# Patient Record
Sex: Female | Born: 1935 | Race: White | Hispanic: No | Marital: Married
Health system: Southern US, Community
[De-identification: ages and names within clinical notes are randomized; demographics above are authoritative.]

---

## 2013-06-30 ENCOUNTER — Inpatient Hospital Stay: Payer: Self-pay | Admitting: Surgery

## 2013-06-30 LAB — URINALYSIS, COMPLETE
Bilirubin,UR: NEGATIVE
Glucose,UR: NEGATIVE mg/dL (ref 0–75)
Hyaline Cast: 2
Ph: 5 (ref 4.5–8.0)
Protein: 30
WBC UR: 128 /HPF (ref 0–5)

## 2013-06-30 LAB — PROTIME-INR
INR: 2.4
Prothrombin Time: 25.6 secs — ABNORMAL HIGH (ref 11.5–14.7)

## 2013-06-30 LAB — CBC
HCT: 43 % (ref 35.0–47.0)
MCH: 30.3 pg (ref 26.0–34.0)
MCHC: 33.4 g/dL (ref 32.0–36.0)
RBC: 4.75 10*6/uL (ref 3.80–5.20)
RDW: 13.8 % (ref 11.5–14.5)
WBC: 17.9 10*3/uL — ABNORMAL HIGH (ref 3.6–11.0)

## 2013-06-30 LAB — COMPREHENSIVE METABOLIC PANEL
Alkaline Phosphatase: 118 U/L (ref 50–136)
Anion Gap: 3 — ABNORMAL LOW (ref 7–16)
BUN: 11 mg/dL (ref 7–18)
Bilirubin,Total: 0.5 mg/dL (ref 0.2–1.0)
Chloride: 107 mmol/L (ref 98–107)
Co2: 29 mmol/L (ref 21–32)
Creatinine: 0.82 mg/dL (ref 0.60–1.30)
Potassium: 4.1 mmol/L (ref 3.5–5.1)
SGOT(AST): 44 U/L — ABNORMAL HIGH (ref 15–37)
SGPT (ALT): 56 U/L (ref 12–78)
Sodium: 139 mmol/L (ref 136–145)
Total Protein: 7.2 g/dL (ref 6.4–8.2)

## 2013-06-30 LAB — LIPASE, BLOOD: Lipase: 49 U/L — ABNORMAL LOW (ref 73–393)

## 2013-07-01 LAB — COMPREHENSIVE METABOLIC PANEL
Albumin: 3 g/dL — ABNORMAL LOW (ref 3.4–5.0)
Alkaline Phosphatase: 100 U/L (ref 50–136)
Anion Gap: 8 (ref 7–16)
BUN: 8 mg/dL (ref 7–18)
Bilirubin,Total: 0.5 mg/dL (ref 0.2–1.0)
Calcium, Total: 8.6 mg/dL (ref 8.5–10.1)
Co2: 28 mmol/L (ref 21–32)
Creatinine: 0.65 mg/dL (ref 0.60–1.30)
EGFR (African American): >60
EGFR (Non-African Amer.): >60
Glucose: 81 mg/dL (ref 65–99)
Osmolality: 280 (ref 275–301)
SGOT(AST): 25 U/L (ref 15–37)
Total Protein: 6.5 g/dL (ref 6.4–8.2)

## 2013-07-01 LAB — MAGNESIUM: Magnesium: 1.6 mg/dL — ABNORMAL LOW

## 2013-07-01 LAB — CBC WITH DIFFERENTIAL/PLATELET
Eosinophil #: 0.3 10*3/uL (ref 0.0–0.7)
HCT: 41.4 % (ref 35.0–47.0)
HGB: 13.9 g/dL (ref 12.0–16.0)
Lymphocyte #: 1.1 10*3/uL (ref 1.0–3.6)
Lymphocyte %: 11.9 %
MCHC: 33.7 g/dL (ref 32.0–36.0)
MCV: 91 fL (ref 80–100)
Neutrophil #: 7 10*3/uL — ABNORMAL HIGH (ref 1.4–6.5)
Neutrophil %: 74.2 %
Platelet: 169 10*3/uL (ref 150–440)
RDW: 14.1 % (ref 11.5–14.5)
WBC: 9.5 10*3/uL (ref 3.6–11.0)

## 2013-07-01 LAB — PROTIME-INR
INR: 2.4
Prothrombin Time: 25.8 secs — ABNORMAL HIGH (ref 11.5–14.7)

## 2013-07-02 LAB — CBC WITH DIFFERENTIAL/PLATELET
Basophil #: 0.1 10*3/uL (ref 0.0–0.1)
Basophil %: 0.7 %
Eosinophil #: 0.1 10*3/uL (ref 0.0–0.7)
Eosinophil %: 0.7 %
HCT: 40.8 % (ref 35.0–47.0)
HGB: 13.9 g/dL (ref 12.0–16.0)
Lymphocyte #: 1.3 10*3/uL (ref 1.0–3.6)
Lymphocyte %: 10.8 %
MCH: 30.4 pg (ref 26.0–34.0)
MCV: 89 fL (ref 80–100)
Monocyte #: 1.5 x10 3/mm — ABNORMAL HIGH (ref 0.2–0.9)
Monocyte %: 12.5 %
Neutrophil #: 9.1 10*3/uL — ABNORMAL HIGH (ref 1.4–6.5)
Platelet: 179 10*3/uL (ref 150–440)
RBC: 4.58 10*6/uL (ref 3.80–5.20)

## 2013-07-02 LAB — BASIC METABOLIC PANEL
Anion Gap: 8 (ref 7–16)
BUN: 8 mg/dL (ref 7–18)
Calcium, Total: 8.2 mg/dL — ABNORMAL LOW (ref 8.5–10.1)
Creatinine: 0.6 mg/dL (ref 0.60–1.30)
EGFR (African American): >60
EGFR (Non-African Amer.): >60
Osmolality: 271 (ref 275–301)
Sodium: 137 mmol/L (ref 136–145)

## 2013-07-02 LAB — PROTIME-INR: Prothrombin Time: 22.2 secs — ABNORMAL HIGH (ref 11.5–14.7)

## 2013-07-03 LAB — COMPREHENSIVE METABOLIC PANEL
Albumin: 2.6 g/dL — ABNORMAL LOW (ref 3.4–5.0)
Alkaline Phosphatase: 90 U/L (ref 50–136)
Bilirubin,Total: 0.8 mg/dL (ref 0.2–1.0)
Chloride: 104 mmol/L (ref 98–107)
Co2: 25 mmol/L (ref 21–32)
Creatinine: 0.66 mg/dL (ref 0.60–1.30)
EGFR (African American): >60
Total Protein: 6.3 g/dL — ABNORMAL LOW (ref 6.4–8.2)

## 2013-07-03 LAB — PROTIME-INR: Prothrombin Time: 21.5 secs — ABNORMAL HIGH (ref 11.5–14.7)

## 2013-07-04 LAB — HEMOGLOBIN: HGB: 12.3 g/dL (ref 12.0–16.0)

## 2013-07-04 LAB — PLATELET COUNT: Platelet: 177 10*3/uL (ref 150–440)

## 2013-07-04 LAB — PROTIME-INR: Prothrombin Time: 21.5 secs — ABNORMAL HIGH (ref 11.5–14.7)

## 2013-07-04 LAB — APTT: Activated PTT: 95.7 secs — ABNORMAL HIGH (ref 23.6–35.9)

## 2013-07-05 LAB — APTT: Activated PTT: >160 secs (ref 23.6–35.9)

## 2013-07-05 LAB — PROTIME-INR: Prothrombin Time: 27.4 secs — ABNORMAL HIGH (ref 11.5–14.7)

## 2013-07-06 LAB — PLATELET COUNT: Platelet: 206 10*3/uL (ref 150–440)

## 2013-07-06 LAB — PROTIME-INR: Prothrombin Time: 32.7 secs — ABNORMAL HIGH (ref 11.5–14.7)

## 2013-07-06 LAB — HEMOGLOBIN: HGB: 11.6 g/dL — ABNORMAL LOW (ref 12.0–16.0)

## 2014-01-28 IMAGING — CT CT ABD-PELV W/O CM
1 of 2 series · 15 of 32 positions shown, 19 images · non-contrast
Comparison: None

REASON FOR EXAM: (1) abd pain; (2) abd pain
COMMENTS:

PROCEDURE:     CT  - CT ABDOMEN AND PELVIS W[DATE]  [DATE]
RESULT:     Indication: Abdominal pain
TECHNIQUE: Multiple axial images from the lung bases to the symphysis pubis
were obtained without oral and without intravenous contrast.

[Series 2: 3mm soft tissue · axial · 0.66mm/px · z∈[-394,-20]mm · 15 of 137 slices shown, 19 images]
[im 6/137  soft-tissue]
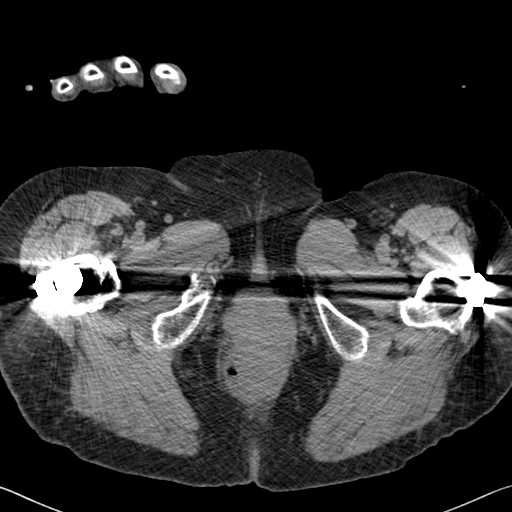
[im 6/137  bone]
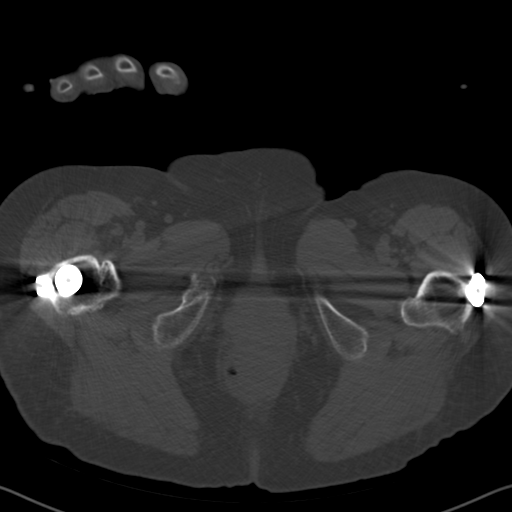
[im 18/137  soft-tissue]
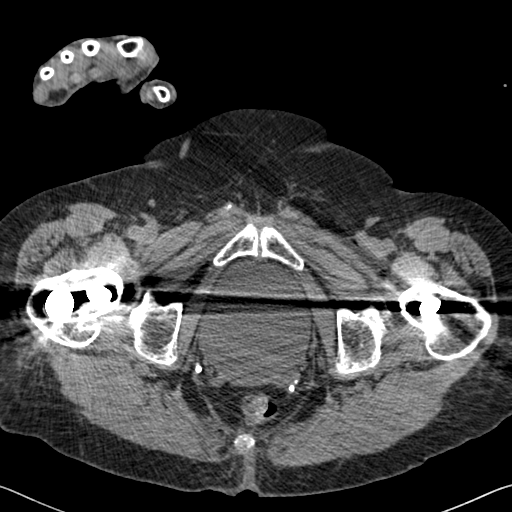
[im 29/137  soft-tissue]
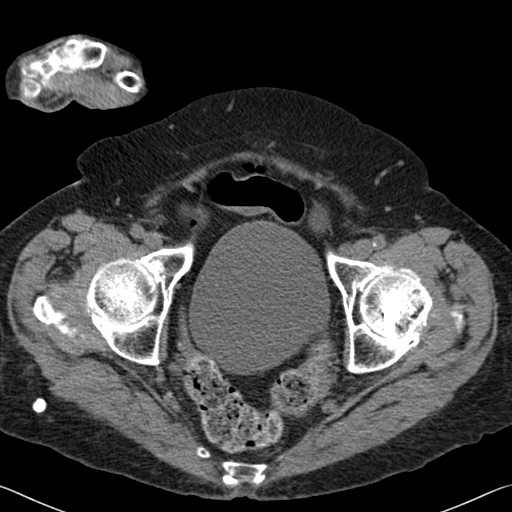
[im 40/137  soft-tissue]
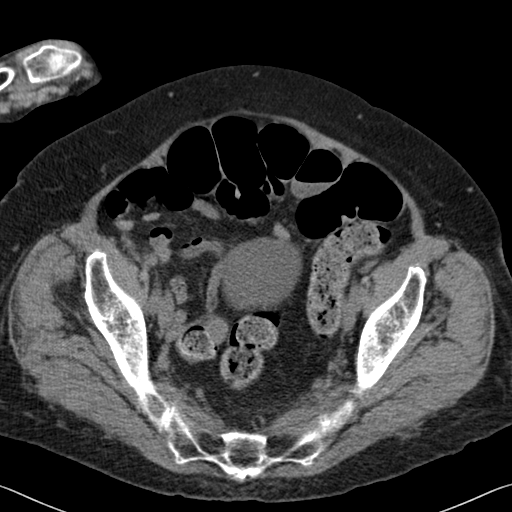
[im 46/137  soft-tissue]
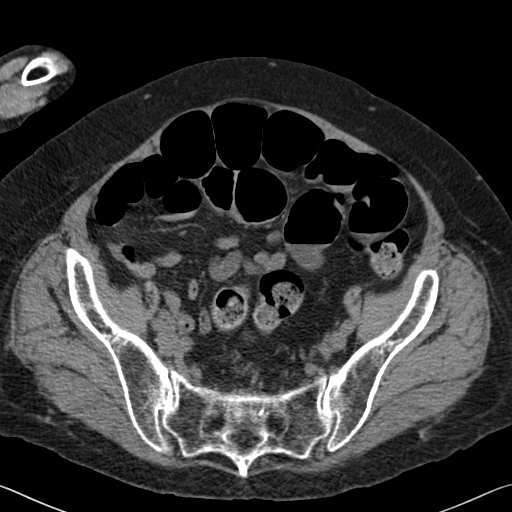
[im 57/137  soft-tissue]
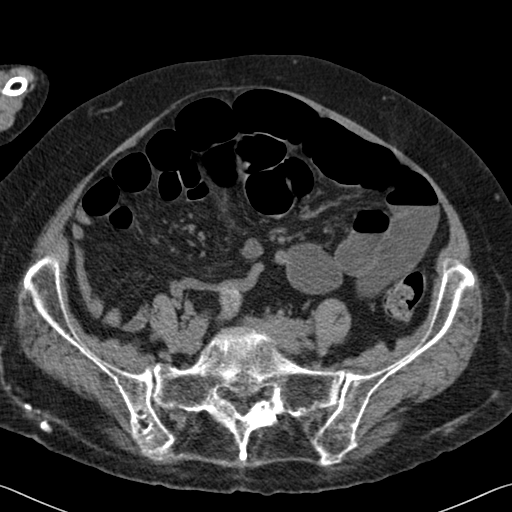
[im 69/137  soft-tissue]
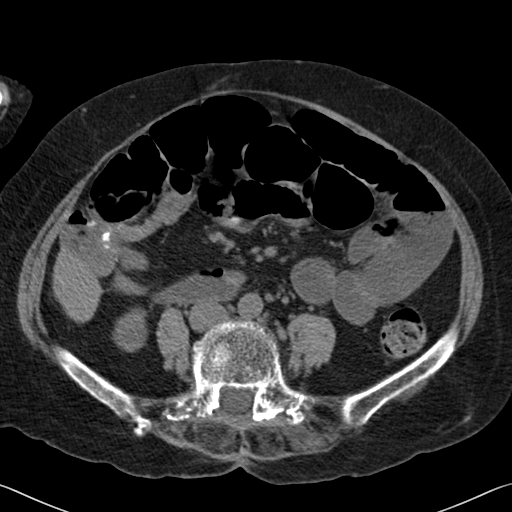
[im 80/137  soft-tissue]
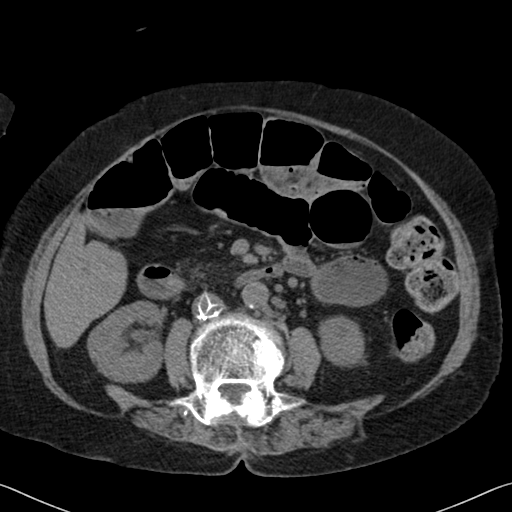
[im 91/137  soft-tissue]
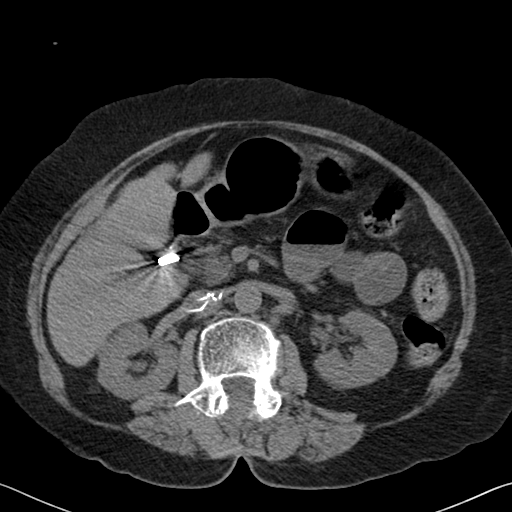
[im 91/137  bone]
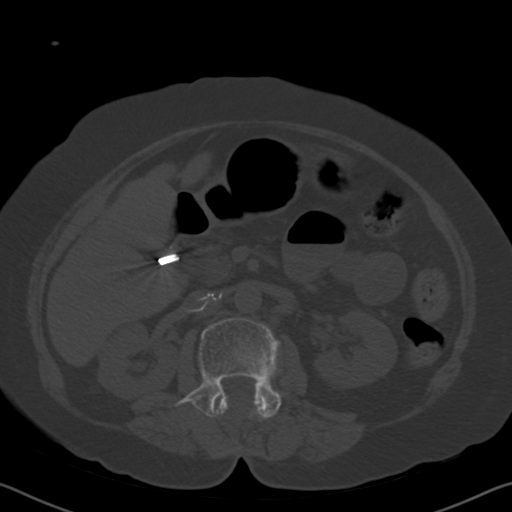
[im 97/137  soft-tissue]
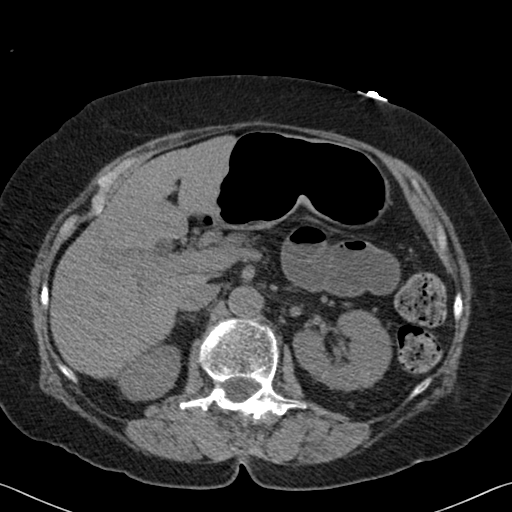
[im 108/137  soft-tissue]
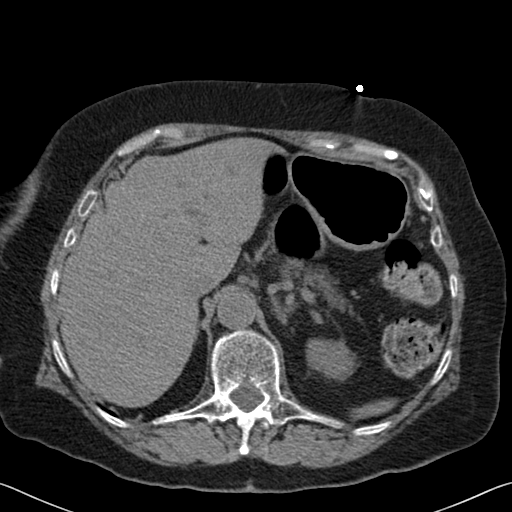
[im 114/137  lung]
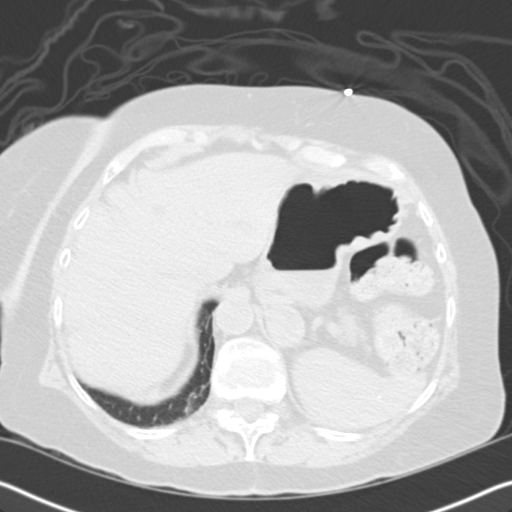
[im 120/137  soft-tissue]
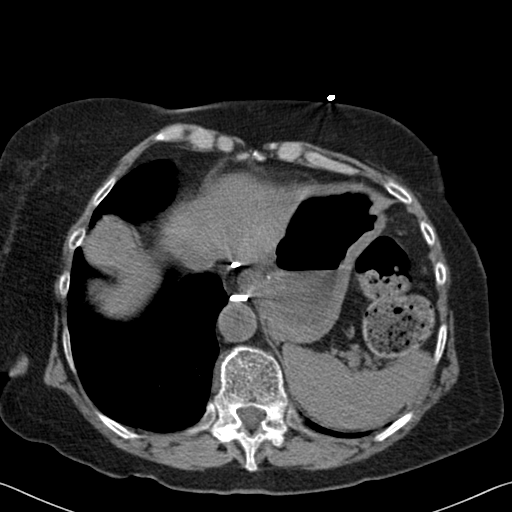
[im 120/137  lung]
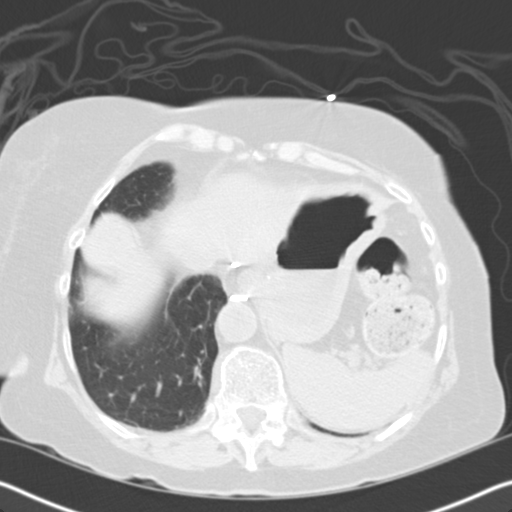
[im 125/137  lung]
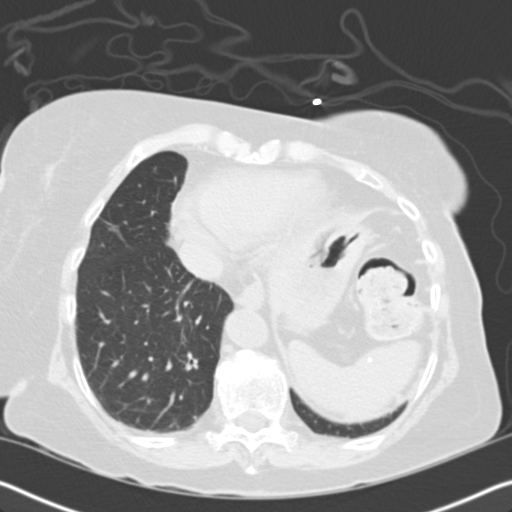
[im 131/137  soft-tissue]
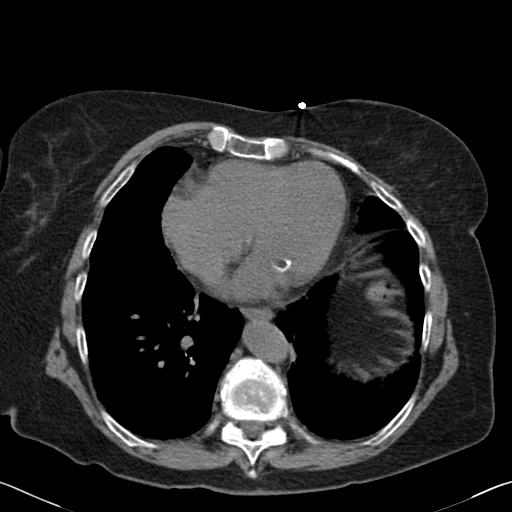
[im 131/137  lung]
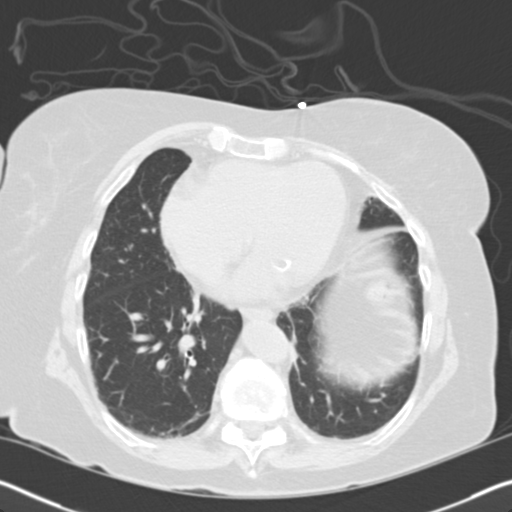

[15 of 32 positions shown; findings below may reference images not displayed]

FINDINGS: The lung bases are clear. There is no pleural or pericardial effusions.

There bilateral nephrolithiasis. No obstructive uropathy. No perinephric
stranding is seen. The kidneys are symmetric in size without evidence for
exophytic mass. The bladder is unremarkable.

The liver demonstrates no focal abnormality. The gallbladder is surgically
absent. The spleen demonstrates no focal abnormality. The adrenal glands and
pancreas are normal.

There is prior colonic resection with anastomotic suture line in the right
upper quadrant. There multiple small bowel air-fluid levels measuring up to
3.2 cm in diameter. The distal ileum is decompressed. Evaluation of the
bowel is somewhat limited secondary to lack of enteric contrast. There is no
pneumoperitoneum, pneumatosis, or portal venous gas. There is no abdominal
or pelvic free fluid. There is no lymphadenopathy.

The abdominal aorta is normal in caliber with atherosclerosis. There is an
IVC filter present.

There is lumbar spine spondylosis.
IMPRESSION: 1. Multiple mildly dilated loops of proximal small bowel with multiple
air-fluid levels and a decompressed distal ileum. This may reflect an ileus
versus small bowel obstruction.

2. Bilateral nonobstructing nephrolithiasis.

[REDACTED]

## 2014-01-30 IMAGING — CR DG CHEST 1V PORT
1 series · 1 of 1 positions shown · non-contrast
Comparison: none

REASON FOR EXAM: check central line placement
COMMENTS:

[ap]
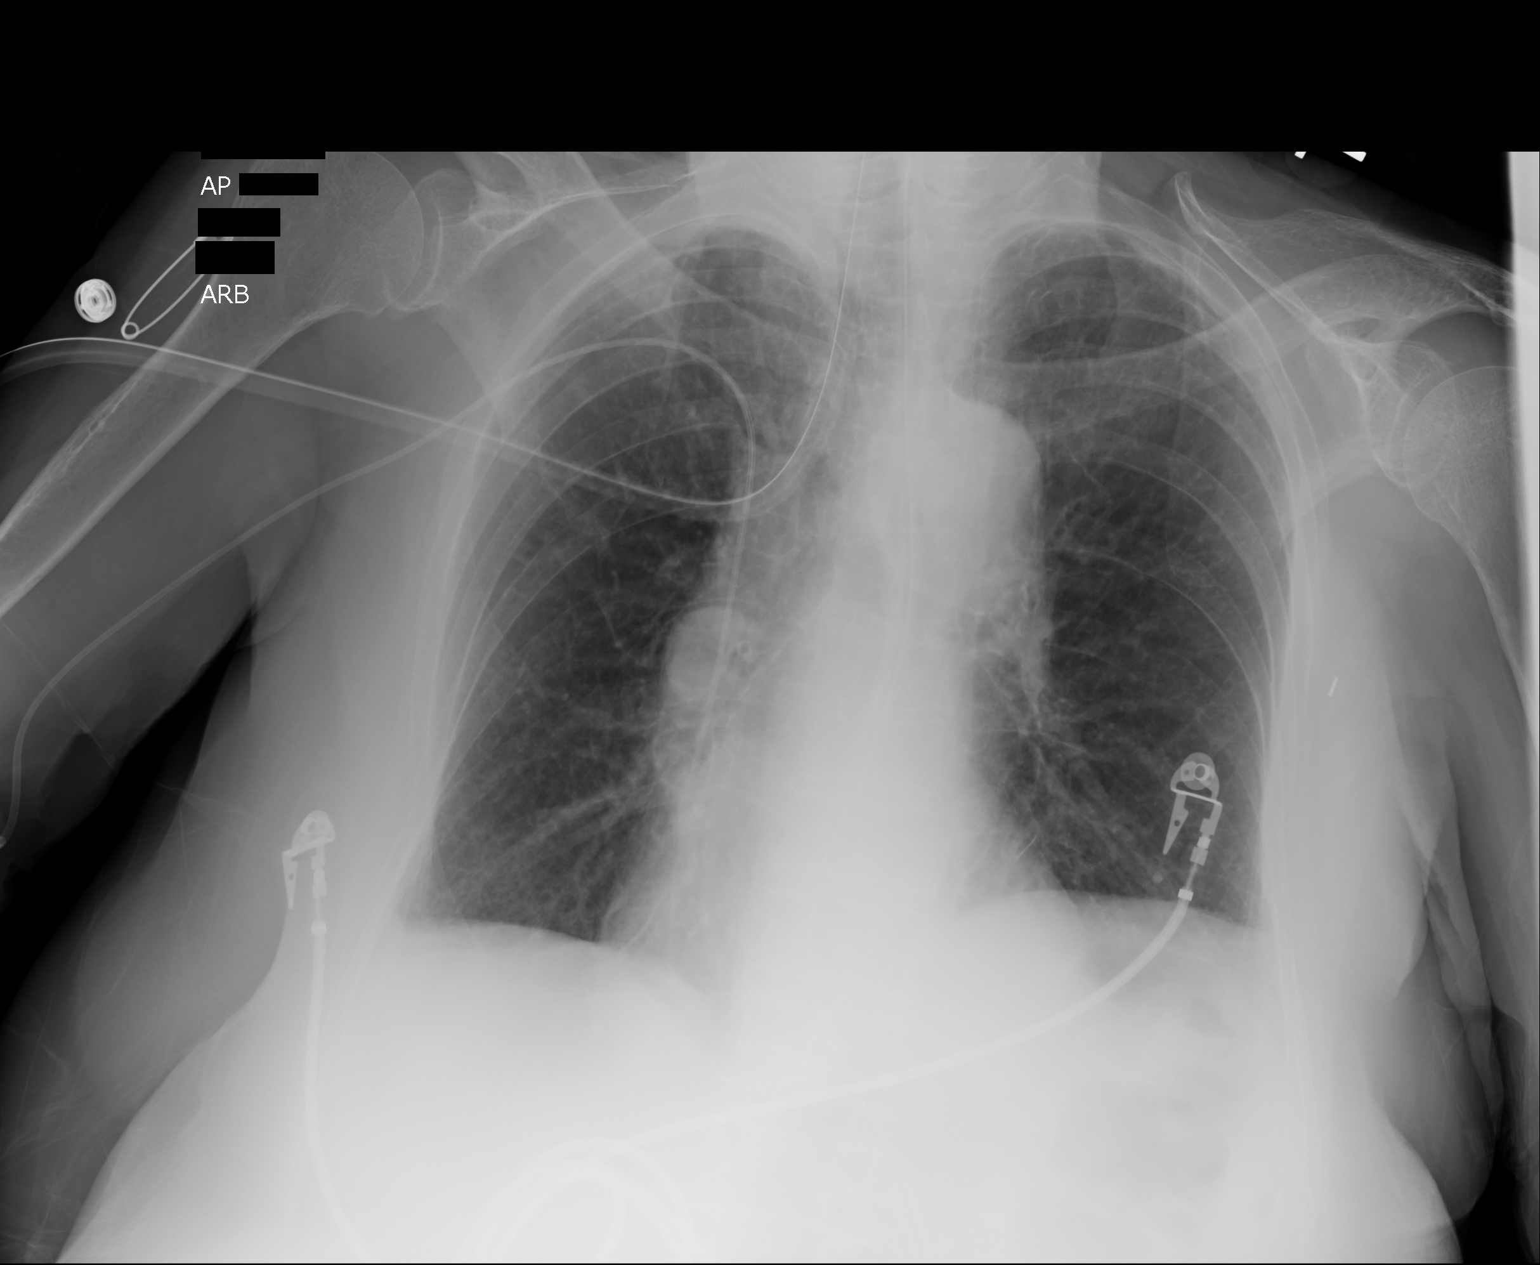

[1 of 1 positions shown; findings below may reference images not displayed]

PROCEDURE:     DXR - DXR PORTABLE CHEST SINGLE VIEW  - July 02, 2013  [DATE]

RESULT:     The patient has undergone placement of a PICC line via the right
upper extremity. The tip of the catheter lies in the region of the distal
SVC. The lungs are adequately inflated. There is no focal infiltrate. The
cardiac silhouette is normal in size. The mediastinum is normal in width.
IMPRESSION: There is no post procedure complication following placement
of the PICC line on the right.

[REDACTED]

## 2015-01-19 NOTE — Discharge Summary (Signed)
PATIENT NAME:  Sara Craig, Sara Craig MR#:  [id number] DATE OF BIRTH:  1932/08/02  DATE OF ADMISSION:  06/30/2013 DATE OF DISCHARGE:  07/06/2013  DISCHARGE DIAGNOSES: 1.  Small bowel obstruction, resolved.  2.  Asthma.  3.  Allergies.  4.  Diabetes mellitus.  5.  Recurrent deep vein thrombosis on Coumadin.  6.  Status post IVC filter.  7.  Status post appendectomy.  8.  Status post cholecystectomy.  9.  Status post colon resection.  10.  History of Nissen fundoplication.   ALLERGIES:  1.  ASPIRIN.  2.  AZITHROMYCIN.  3.  CLARITIN.  4.  CODEINE.  5.  GLYCOPYRROLATE.  6.  HYDROCHLOROTHIAZIDE.  7.  INAPSINE.  8.  ZYVOX.  9.  SULFA.  10.  IV DYE.  11.  VISTARIL.  12.  FOOD ALLERGIES.  DISCHARGE MEDICATIONS:   1.  Albuterol 3 mL inhaled q. 6 hours as needed shortness of breath.  2.   1000 mcg by mouth daily.  3.  Calcium plus vitamin D 1 tab three times daily.  4.  Flovent 1 puff three times daily.  5.  Flexeril 5 mg as needed.  6.  Neurontin 600 twice daily.  7.  Meclizine 25 three times daily as needed nausea and vomiting.  8.  Methadone 5 mg by mouth twice daily.  9.  Omeprazole 40 mg by mouth daily.  10.  Percocet 1 tab by mouth q. 4 hours as needed pain.  11.  Potassium chloride 20 mEq 1 tab twice daily.  12.  Warfarin 2.5 mg daily.  13.  Vitamin B12 injectable once a month.  14.  Vitamin D injectable once a month.   INDICATION FOR ADMISSION:  Ms. Sara Craig is a pleasant 81 year old female with multiple abdominal histories who comes in with abdominal pain, nausea and vomiting.  She had a CT concerning for a small bowel obstruction and she was admitted for management.   HOSPITAL COURSE:  Ms. Sara Craig was admitted and had an NG tube placed upon admission.  Her Coumadin was held until it was below therapeutic which was below 2, at which time she was begun on a heparin drip.  Internal medicine also assisted with diabetes control.  As her bowel function resolved radiographically and  clinically NG tube was removed and she was advanced from clear liquid to regular diet and resumed her warfarin at the time of discharge.  She was taking good by mouth and voiding and stooling without difficulties.   DISCHARGE INSTRUCTIONS:  Ms. Sara Craig is to call or return to the ED if has increased abdominal pain, nausea, vomiting or decreased bowel movement.  She is to follow up with me in approximately one week.    ____________________________ Si Raiderhristopher A. Kendahl Bumgardner, MD cal:ea D: 07/14/2013 20:13:36 ET T: 07/14/2013 23:36:33 ET JOB#: [id number]  cc: Cristal Deerhristopher A. Naomia Lenderman, MD, <Dictator> Jarvis NewcomerHRISTOPHER A Lashaun Krapf MD ELECTRONICALLY SIGNED 07/18/2013 13:11

## 2015-01-19 NOTE — Consult Note (Signed)
PATIENT NAME:  Sara Craig, Sara Craig MR#:  [id number] DATE OF BIRTH:  1932/01/23  INTERNAL MEDICINE CONSULTATION REPORT  DATE OF CONSULTATION:  06/30/2013  CONSULTING PHYSICIAN:  Dr. Juliann Pulse, of surgery.  REASON FOR CONSULTATION:  DVT, diabetes.   HISTORY OF PRESENTING ILLNESS: A 81 year old female patient with history of multiple abdominal surgeries, diabetes, history of deep vein thrombosis on Coumadin, presents to the Emergency Room with abdominal pain. The patient had a CT of the abdomen done which showed a small bowel obstruction. An NG tube was placed and she was admitted to the surgical service.   The patient's abdominal pain is better at this time. She did have nausea, but no vomiting. This has resolved. Does not complain of any shortness of breath, chest pain. The patient normally goes to [place]; is visiting her sister here. Had the abdominal pain. Had to be brought to the Emergency Room here.   Her DVT initially was in the left lower extremity in 1962, had total of 3 episodes will now, and the last episode was 10 years back and has been on chronic Coumadin therapy. INR is therapeutic at 2.5.   She has multiple allergies.   PAST MEDICAL HISTORY: Asthma.   1.  Allergies, causing shortness of breath.  2.  Constipation.  3.  Diabetes mellitus.  4.  DVT with 3 episodes, the first one in 1962 and the last one in 2004, on Coumadin.   PAST SURGICAL HISTORY: Appendectomy, cholecystectomy, colon resection, and Nissen fundoplication.   SOCIAL HISTORY: The patient lives at home. Ambulates on her own. Does not smoke. No alcohol. Her son died of an MI 2 months back and presently she is in bereavement. The patient is from [place] and is visiting her sister here in [place].   ALLERGIES: Has multiple, including aspirin, azithromycin, Claritin, codeine, glycopyrrolate, hydrochlorothiazide, Inapsine, Zyvox, sulfa, IV dye, Vistaril. Also has multiple food allergies.   FAMILY HISTORY: Diabetes.    REVIEW OF SYSTEMS:  CONSTITUTIONAL: Complains of some fatigue. No weight loss, weight gain.  EYES: No blurred vision, pain.   ENT: No tinnitus, ear pain, hearing loss.  RESPIRATORY: Has chronic allergies. Has asthma. No shortness of breath.  GASTROINTESTINAL: Has abdominal pain, nausea, bowel obstruction.  GENITOURINARY: No dysuria, hematuria.  ENDOCRINE: No polyuria, nocturia, thyroid problems.  HEMATOLOGIC AND LYMPHATIC: No anemia, easy bruising, bleeding.  INTEGUMENTARY: No rash, lesions.  MUSCULOSKELETAL: No arthritis, back pain.  NEUROLOGIC: No focal numbness or seizures.  PSYCHIATRIC: No anxiety or depression.   HOME MEDICATIONS:  1.  Albuterol inhalation, every 6 hours as needed for shortness of breath.  2.  Biotin 1000 mcg oral daily.  3.  Calcium/vitamin D oral 3 times a day.  4.  Flexeril 5 mg oral daily.  5.  Flovent, 1 puff inhaled 2 times a day.  6.  Gabapentin 600 mg oral 2 times a day.  7.  GI cocktail, as needed.  8. Meclizine 25 mg oral 3 times a day as needed.  9.  Methadone 5 mg oral 2 times a day.  10.  Omeprazole 40 mg oral once a day.  11.  Percocet 5/325, 1 tablet oral every 4 hours as needed for pain.  12.  Potassium chloride 20 mEq oral 2 times a day.  13.  Vitamin B12 1000 mcg once a month.  14.  Warfarin 2.5 mg oral daily.   PHYSICAL EXAMINATION: VITAL SIGNS: Shows a temperature of 99, pulse of 80, blood pressure 127/68, saturating  97%  on room air.  GENERAL: Moderately-built Caucasian female patient, sitting up in bed in no significant distress. Has an NG tube in  place.  PSYCHIATRIC: Alert and oriented x 3. Pleasant, no anxiety.  HEENT: Atraumatic, normocephalic. Oral mucosa dry and pink. No oral ulcers or thrush. External ears and nose normal. No pallor. No icterus. Pupils bilaterally equal and reactive to light.  NECK: Supple. No thyromegaly or palpable lymph nodes. Trachea midline. No carotid bruits or JVD.  CARDIOVASCULAR: S1, S2, without any  murmurs. Peripheral pulses 2+.  RESPIRATORY: Normal work of breathing. Good air entry. Has some crackles in the right base.  GASTROINTESTINAL: Soft abdomen. Tenderness diffusely. Noted to be guarding. Bowel sounds increased. No hepatosplenomegaly palpable.  GENITOURINARY: No CVA tenderness or bladder distention.  SKIN: Warm and dry. No petechiae, rash, ulcers. Does have stasis changes in both lower extremities.  MUSCULOSKELETAL: No joint swelling, redness, effusion of the large joints. Normal muscle tone.  NEUROLOGICAL: Motor strength 5/5 in upper and lower extremities.  LYMPHATIC: No cervical lymphadenopathy.   LAB STUDIES: Show a glucose of 175, BUN 11, creatinine 0.82, sodium 139, potassium 4.1, lipase of 49.   AST, ALT, alkaline phosphatase, bilirubin normal.   WBC 17.9, hemoglobin 14.4, platelets 189, INR 2.4.   Urinalysis shows bacteria 3+, and WBC 128.   EKG shows normal sinus rhythm. No acute ST-T wave changes.   CT scan of the abdomen and pelvis without contrast showed small bowel obstruction and bilateral non-obstructing nephrolithiasis.   ASSESSMENT AND PLAN: 1. Small bowel obstruction, likely secondary from medications from her prior multiple surgeries. The patient is also on narcotic medications which could be contributing to this, along with the urinary tract infection the patient has. The patient presently has an NG tube, but there is nothing in the canister and the patient is on low-intermittent suction. I discussed the case with  Dr. Juliann PulseLundquist. He is hoping this episode will resolve without any surgery, but the patient should be low-risk for surgery considering no cardiac or pulmonary issues that are active, but with a history of deep vein thrombosis on Coumadin, will have to hold the Coumadin, let the INR trend down and switch to heparin while she awaits surgery. Presently, INR is therapeutic at 2.5.  2.  History of deep vein thrombosis: Continue the Coumadin; hold if  surgery needed.  3.  Urinary tract infection: The patient is on ciprofloxacin.  4.  Asthma: Use inhalers p.r.n. No steroids needed. No active wheezing.  5. Diabetes mellitus: Will start her on sliding-scale insulin while she is n.p.o. Hold oral medications.  6.  Deep vein thrombosis prophylaxis, on Coumadin: Therapeutic.  7.  CODE STATUS: Full code.   Time spent today on this case was fifty minutes. The case has been discussed with Dr. Juliann PulseLundquist    ____________________________ Molinda BailiffSrikar R. Elvert Cumpton, MD srs:dm D: 06/30/2013 13:50:30 ET T: 06/30/2013 14:30:53 ET JOB#: [id number]  cc: Wardell HeathSrikar R. Alexandria Current, MD, <Dictator> Orie FishermanSRIKAR R Verlin Uher MD ELECTRONICALLY SIGNED 06/30/2013 16:46

## 2015-01-19 NOTE — H&P (Signed)
PATIENT NAME:  Sara Craig, Tamikka G MR#:  [id number] DATE OF BIRTH:  August 12, 1932  ADMITTING PHYSICIAN: Salome Holmeshris Kineta Fudala, MD.  REASON FOR ADMISSION: Probable small bowel obstruction.   HISTORY OF PRESENT ILLNESS:  Ms. Sara Craig is a pleasant 81 year old female with history of multiple abdominal surgeries, DVT, and diabetes, who presents with approximately 1 week of not having a bowel movement. She says that she has over the last day had nausea which has been improved. She is visiting her sister here. All of her surgeries have been done in [place]. She has never had a bowel obstruction before and otherwise has been feeling fine up until then, still passing gas. Last bowel movement was I believe approx 1 week.   No fevers, chills, night sweats, shortness of breath, cough, chest pain, diarrhea, nausea , vomiting or dysuria or hematuria. Is on chronic narcotics for back pain but usually takes a laxative.   PAST MEDICAL HISTORY:  1.  Asthma.  2.  Allergies.  3.  Diabetes mellitus.  4.  DVT with 3 episodes, the first one in 1962, the last in 2004, on Coumadin. Also with an IVC filter.  5.  History of appendectomy.  6.  History of cholecystectomy.  7.  History of colon resection, likely right. Reason unknown.  8.  Nissen fundoplication.   ALLERGIES: ASPIRIN, AZITHROMYCIN, CLARITIN, CODEINE, GLYCOPYRROLATE, HYDROCHLOROTHIAZIDE, INAPSINE, ZYVOX, SULFA, IV DYE, VISTARIL. ALSO WITH MULTIPLE FOOD ALLERGIES.   HOME MEDICATIONS:  1.  Albuterol.  2.  Biotin. 3.  Calcium.  4.  Flexeril.  5.  Flovent.  6.  Gabapentin.  7.  Gastrointestinal cocktail as needed.  8.  Meclizine.  9.  Methadone.  10.  Omeprazole.  11.  Percocet.  12.  Potassium chloride.  13.  Vitamin B12.  14.  Vitamin D once a month.  15.  Warfarin 2.5 p.o. daily.   SOCIAL HISTORY: Lives in [place]. She is here visiting her sister. No tobacco use. No alcohol. History of a son with MI 2 months ago.   FAMILY HISTORY: Diabetes.   REVIEW  OF SYSTEMS: A 12-point review of systems was obtained. Pertinent positives and negatives as above.   PHYSICAL EXAMINATION:  VITAL SIGNS: Temperature 99.4, pulse 80, blood pressure 127/68, respirations 18 and 97% on room air.  GENERAL: No acute distress. Alert and oriented x3.  HEAD: Normocephalic, atraumatic.  EYES: No scleral icterus. No conjunctivitis.  FACE: No nasal trauma. Normal external nose. Normal external ears.  CHEST: Lungs clear to auscultation. Moving air well.  HEART: Regular rate and rhythm. No murmurs, rubs, or gallops.  ABDOMEN: Soft, mildly tender, mildly distended.  EXTREMITIES: Moves all extremities well. Strength 5/5.  NEUROLOGIC: Cranial nerves II through XII grossly intact.   LABORATORIES:  white cell count 17.9, hemoglobin 14.4, hematocrit 43, platelets 189. Creatinine 0.82, blood glucose elevated at 175. INR is 2.4. Urinalysis shows positive nitrites, 2+ leukocyte esterase, 128 whites.   CT shows a dilated proximal bowel with distal collapse.   ASSESSMENT AND PLAN: Ms. Sara Craig is a pleasant 81 year old female with multiple abdominal surgeries and history of deep vein thrombosis on Coumadin. I appreciate internal medicine assistance with diabetes and potential need for a heparin drip versus Lovenox shots. An alternative is ileus due to urinary tract infection. We will treat urinary tract infection and place nasogastric tube too, if it is a bowel obstruction, to attempt  nonoperative management. We will continue to follow for need of potential surgery.   ____________________________ Si Raiderhristopher A. Shequilla Goodgame,  MD cal:np D: 06/30/2013 18:20:23 ET T: 06/30/2013 19:28:16 ET JOB#: [id number]  cc: Cristal Deer A. Jerris Fleer, MD, <Dictator> Jarvis Newcomer MD ELECTRONICALLY SIGNED 07/11/2013 21:11

## 2023-04-30 DEATH — deceased
